# Patient Record
Sex: Female | Born: 1990 | Race: Black or African American | Hispanic: No | Marital: Single | Smoking: Former smoker
Health system: Southern US, Community
[De-identification: ages and names within clinical notes are randomized; demographics above are authoritative.]

## PROBLEM LIST (undated history)

## (undated) DIAGNOSIS — D759 Disease of blood and blood-forming organs, unspecified: Secondary | ICD-10-CM

## (undated) DIAGNOSIS — N83209 Unspecified ovarian cyst, unspecified side: Secondary | ICD-10-CM

## (undated) HISTORY — PX: OTHER SURGICAL HISTORY: SHX169

## (undated) HISTORY — DX: Unspecified ovarian cyst, unspecified side: N83.209

## (undated) HISTORY — DX: Disease of blood and blood-forming organs, unspecified: D75.9

---
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
## 2013-10-25 ENCOUNTER — Emergency Department: Payer: Self-pay | Admitting: Internal Medicine

## 2015-01-28 ENCOUNTER — Encounter: Payer: Self-pay | Admitting: Emergency Medicine

## 2015-01-28 ENCOUNTER — Emergency Department
Admission: EM | Admit: 2015-01-28 | Discharge: 2015-01-28 | Disposition: A | Discharge status: Home / Self Care | Payer: Self-pay | Attending: Emergency Medicine | Admitting: Emergency Medicine

## 2015-01-28 DIAGNOSIS — Y9389 Activity, other specified: Secondary | ICD-10-CM | POA: Insufficient documentation

## 2015-01-28 DIAGNOSIS — Y9289 Other specified places as the place of occurrence of the external cause: Secondary | ICD-10-CM | POA: Insufficient documentation

## 2015-01-28 DIAGNOSIS — Y998 Other external cause status: Secondary | ICD-10-CM | POA: Insufficient documentation

## 2015-01-28 DIAGNOSIS — X58XXXA Exposure to other specified factors, initial encounter: Secondary | ICD-10-CM | POA: Insufficient documentation

## 2015-01-28 DIAGNOSIS — T783XXA Angioneurotic edema, initial encounter: Secondary | ICD-10-CM

## 2015-01-28 MED ORDER — DIPHENHYDRAMINE HCL 50 MG PO CAPS
50.0000 mg | ORAL_CAPSULE | Freq: Once | ORAL | Status: AC
  Administered 2015-01-28: 50 mg via ORAL
  Filled 2015-01-28: qty 1

## 2015-01-28 MED ORDER — PREDNISONE 10 MG PO TABS: ORAL_TABLET | ORAL | Status: DC

## 2015-01-28 NOTE — ED Notes (Signed)
AAOx3.  Skin warm and dry.  NAD.  D/C home 

## 2015-01-28 NOTE — Discharge Instructions (Signed)
Angioedema Angioedema is sudden puffiness (swelling), often of the skin. It can happen:  On your face or privates (genitals).  In your belly (abdomen) or other body parts. It usually happens quickly and gets better in 1 or 2 days. It often starts at night and is found when you wake up. You may get red, itchy patches of skin (hives). Attacks can be dangerous if your breathing passages get puffy. The condition may happen only once, or it can come back at random times. It may happen for several years before it goes away for good. HOME CARE  Only take medicines as told by your doctor.  Always carry your emergency allergy medicines with you.  Wear a medical bracelet as told by your doctor.  Avoid things that you know will cause attacks (triggers). GET HELP IF:  You have another attack.  Your attacks happen more often or get worse.  The condition was passed to you by your parents and you want to have children. GET HELP RIGHT AWAY IF:   Your mouth, tongue, or lips are very puffy.  You have trouble breathing.  You have trouble swallowing.  You pass out (faint). MAKE SURE YOU:   Understand these instructions.  Will watch your condition.  Will get help right away if you are not doing well or get worse. Document Released: 06/07/2009 Document Revised: 04/09/2013 Document Reviewed: 02/10/2013 [facility] Patient Information 2015 [place], [place]. This information is not intended to replace advice given to you by your health care provider. Make sure you discuss any questions you have with your health care provider.   RETURN TO ER IMMEDIATELY IF ANY PROBLEMS SUCH AS SWALLOWING, SPEAKING OR BREATHING CONTINUE TAKING BENADRYL FOR ITCHING EVERY 6 HOURS  PREDNISONE FOR 3 DAYS AS DIRECTED

## 2015-01-28 NOTE — ED Notes (Signed)
Pt states she woke up this am with left upper lip swelling, denies allergies, pt states area is sore.

## 2015-01-28 NOTE — ED Provider Notes (Signed)
[facility] Emergency Department Provider Note  ____________________________________________  Time seen:  12:39 PM  I have reviewed the triage vital signs and the nursing notes.   HISTORY  Chief Complaint Oral Swelling   HPI [facility] is a 24 y.o. female patient is here today with complaint of left upper lip with swelling. She states she woke up this morning and noticed it. There is been no injury. She is unaware of any pimple in the area. She denies any previous allergies or problems since this. She has not taken any medication, new foods, new drinks, or new clothing. Patient denies any difficulty breathing or swallowing. She states the area is slightly sore to touch. She denies any fever. Currently her pain is 4 out of 10.  History reviewed. No pertinent past medical history.  There are no active problems to display for this patient.   History reviewed. No pertinent past surgical history.  Current Outpatient Rx  Name  Route  Sig  Dispense  Refill  . predniSONE (DELTASONE) 10 MG tablet      Take 3 tablets qd x 3 days   9 tablet   0     Allergies Review of patient's allergies indicates no known allergies.  No family history on file.  Social History History  Substance Use Topics  . Smoking status: Never Smoker   . Smokeless tobacco: Not on file  . Alcohol Use: No    Review of Systems Constitutional: No fever/chills Eyes: No visual changes. ENT: No sore throat. Cardiovascular: Denies chest pain. Respiratory: Denies shortness of breath. Gastrointestinal: No abdominal pain.  No nausea, no vomiting.   Genitourinary: Negative for dysuria. Musculoskeletal: Negative for back pain. Skin: Negative for rash.  Neurological: Negative for headaches, focal weakness or numbness.  10-point ROS otherwise negative.  ____________________________________________   PHYSICAL EXAM:  VITAL SIGNS: ED Triage Vitals  Enc Vitals Group     BP  01/28/15 1226 118/77 mmHg     Pulse Rate 01/28/15 1226 76     Resp 01/28/15 1226 18     Temp 01/28/15 1226 98 F (36.7 C)     Temp Source 01/28/15 1226 Oral     SpO2 01/28/15 1226 100 %     Weight 01/28/15 1226 188 lb (85.276 kg)     Height 01/28/15 1226  (1.651 m)     Head Cir --      Peak Flow --      Pain Score 01/28/15 1226 4     Pain Loc --      Pain Edu? --      Excl. in GC? --     Constitutional: Alert and oriented. Well appearing and in no acute distress. Eyes: Conjunctivae are normal. PERRL. EOMI. Head: Atraumatic. Nose: No congestion/rhinnorhea. Mouth/Throat: Mucous membranes are moist.  Oropharynx non-erythematous. Left upper lip very minimal edema was noted. No pimple or vesicle noted in the area. There is no trauma to the teeth or gums. Neck: No stridor.  Supple Hematological/Lymphatic/Immunilogical: No cervical lymphadenopathy. Cardiovascular: Normal rate, regular rhythm. Grossly normal heart sounds.  Good peripheral circulation. Respiratory: Normal respiratory effort.  No retractions. Lungs CTAB. Gastrointestinal: Soft and nontender. No distention Musculoskeletal: No lower extremity tenderness nor edema.  No joint effusions. Neurologic:  Normal speech and language. No gross focal neurologic deficits are appreciated. No gait instability. Skin:  Skin is warm, dry and intact. No rash noted. See note on mouth for skin findings Psychiatric: Mood and affect are  normal. Speech and behavior are normal.  ____________________________________________   LABS (all labs ordered are listed, but only abnormal results are displayed)  Labs Reviewed - No data to display  PROCEDURES  Procedure(s) performed: None  Critical Care performed: No  ____________________________________________   INITIAL IMPRESSION / ASSESSMENT AND PLAN / ED COURSE  Pertinent labs & imaging results that were available during my care of the patient were reviewed by me and considered in my  medical decision making (see chart for details).  Patient was observed and no continued upper lid swelling was noted. Patient still denies any difficulty with swallowing or breathing. She talks in complete sentences lungs are still clear. Patient states she will continue taking Benadryl as needed she was also given a prescription for prednisone for 3 days. She is aware that if anything worsens she is to comes immediately to the emergency room. ____________________________________________   FINAL CLINICAL IMPRESSION(S) / ED DIAGNOSES  Final diagnoses:  Angioedema, initial encounter      Tommi Rumps, PA-C 01/28/15 1341  Sharman Cheek, MD 01/28/15 [phone]

## 2017-01-07 ENCOUNTER — Encounter: Payer: Self-pay | Admitting: Emergency Medicine

## 2017-01-07 ENCOUNTER — Emergency Department: Payer: Self-pay

## 2017-01-07 DIAGNOSIS — Y929 Unspecified place or not applicable: Secondary | ICD-10-CM | POA: Insufficient documentation

## 2017-01-07 DIAGNOSIS — S90512A Abrasion, left ankle, initial encounter: Secondary | ICD-10-CM | POA: Insufficient documentation

## 2017-01-07 DIAGNOSIS — S93402A Sprain of unspecified ligament of left ankle, initial encounter: Secondary | ICD-10-CM | POA: Insufficient documentation

## 2017-01-07 DIAGNOSIS — Y939 Activity, unspecified: Secondary | ICD-10-CM | POA: Insufficient documentation

## 2017-01-07 DIAGNOSIS — Y999 Unspecified external cause status: Secondary | ICD-10-CM | POA: Insufficient documentation
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
## 2017-01-08 ENCOUNTER — Emergency Department
Admission: EM | Admit: 2017-01-08 | Discharge: 2017-01-08 | Disposition: A | Discharge status: Home / Self Care | Payer: Self-pay | Attending: Emergency Medicine | Admitting: Emergency Medicine

## 2017-01-08 DIAGNOSIS — S93402A Sprain of unspecified ligament of left ankle, initial encounter: Secondary | ICD-10-CM

## 2017-01-08 DIAGNOSIS — T148XXA Other injury of unspecified body region, initial encounter: Secondary | ICD-10-CM

## 2017-01-08 MED ORDER — BACITRACIN ZINC 500 UNIT/GM EX OINT
TOPICAL_OINTMENT | CUTANEOUS | Status: AC
  Administered 2017-01-08: 1 via TOPICAL

## 2017-01-08 MED ORDER — IBUPROFEN 600 MG PO TABS
600.0000 mg | ORAL_TABLET | Freq: Once | ORAL | Status: AC
  Administered 2017-01-08: 600 mg via ORAL
  Filled 2017-01-08: qty 1

## 2017-01-08 MED ORDER — IBUPROFEN 600 MG PO TABS: ORAL_TABLET | ORAL | 0 refills | Status: DC

## 2017-01-08 NOTE — ED Notes (Addendum)
Pt with swelling noted to left ankle. Pt complains of pain to medial left ankle. Bruising noted to medial left ankle and arch or left foot. Pt with abrasion with scabs noted to medial left ankle. Cms intact to left toes. Ice applied, pt encouraged to keep left foot elevated.

## 2017-01-08 NOTE — Discharge Instructions (Signed)
As we discussed, you do not have any broken or dislocated bones in your foot or ankle, but you do have an ankle sprain.  Please read through the included information about routine injury care (RICE = rest, ice, compression, elevation), and take over-the-counter pain medicine according to label instructions.  If you do not have any reason to avoid ibuprofen, you can also consider taking ibuprofen 600 mg 3 times a day with meals, but do this for no more than 5 days as it may cause to some stomach discomfort over time.  Use crutches if provided and you may bear weight as tolerated.  Follow-up is recommended with the orthopedic surgeon or with your regular doctor.  Keep the abrasion clean and dry and apply antibiotic ointment twice daily after washing it.
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
## 2017-01-08 NOTE — ED Notes (Addendum)
Abrasion cleansed with wound cleanser and dressed with antibiotic cream. Ace wrap applied to left ankle.

## 2017-02-13 ENCOUNTER — Encounter: Payer: Self-pay | Admitting: Emergency Medicine

## 2017-02-13 ENCOUNTER — Emergency Department: Payer: Self-pay

## 2017-02-13 ENCOUNTER — Emergency Department
Admission: EM | Admit: 2017-02-13 | Discharge: 2017-02-13 | Disposition: A | Discharge status: Home / Self Care | Payer: Self-pay | Attending: Emergency Medicine | Admitting: Emergency Medicine

## 2017-02-13 DIAGNOSIS — S93402A Sprain of unspecified ligament of left ankle, initial encounter: Secondary | ICD-10-CM | POA: Insufficient documentation

## 2017-02-13 DIAGNOSIS — Y929 Unspecified place or not applicable: Secondary | ICD-10-CM | POA: Insufficient documentation

## 2017-02-13 DIAGNOSIS — Y999 Unspecified external cause status: Secondary | ICD-10-CM | POA: Insufficient documentation

## 2017-02-13 DIAGNOSIS — Y9389 Activity, other specified: Secondary | ICD-10-CM | POA: Insufficient documentation

## 2017-02-13 NOTE — Discharge Instructions (Signed)
Ice and elevate ankle as needed for swelling. Continue using cam walker. Continue taking ibuprofen as needed for pain and inflammation. Follow-up with Dr. Orland Jarredroxler as needed for ankle pain.
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
## 2017-02-20 ENCOUNTER — Encounter: Payer: Self-pay | Admitting: *Deleted

## 2017-02-20 ENCOUNTER — Emergency Department
Admission: EM | Admit: 2017-02-20 | Discharge: 2017-02-20 | Disposition: A | Discharge status: Home / Self Care | Payer: Self-pay | Attending: Emergency Medicine | Admitting: Emergency Medicine

## 2017-02-20 DIAGNOSIS — S93402A Sprain of unspecified ligament of left ankle, initial encounter: Secondary | ICD-10-CM

## 2017-02-20 DIAGNOSIS — S93402D Sprain of unspecified ligament of left ankle, subsequent encounter: Secondary | ICD-10-CM | POA: Insufficient documentation

## 2017-02-20 MED ORDER — NAPROXEN 500 MG PO TABS: 500.0000 mg | ORAL_TABLET | Freq: Two times a day (BID) | ORAL | 0 refills | Status: DC
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
## 2017-06-05 ENCOUNTER — Emergency Department
Admission: EM | Admit: 2017-06-05 | Discharge: 2017-06-05 | Disposition: A | Discharge status: Home / Self Care | Payer: Self-pay | Attending: Emergency Medicine | Admitting: Emergency Medicine

## 2017-06-05 ENCOUNTER — Encounter: Payer: Self-pay | Admitting: Emergency Medicine

## 2017-06-05 DIAGNOSIS — A084 Viral intestinal infection, unspecified: Secondary | ICD-10-CM | POA: Insufficient documentation

## 2017-06-05 LAB — CBC
HCT: 37.1 % (ref 35.0–47.0)
Hemoglobin: 12.6 g/dL (ref 12.0–16.0)
MCH: 29.2 pg (ref 26.0–34.0)
MCHC: 34.1 g/dL (ref 32.0–36.0)
MCV: 85.7 fL (ref 80.0–100.0)
Platelets: 253 10*3/uL (ref 150–440)
RBC: 4.33 MIL/uL (ref 3.80–5.20)
RDW: 17.6 % — ABNORMAL HIGH (ref 11.5–14.5)
WBC: 5.8 10*3/uL (ref 3.6–11.0)

## 2017-06-05 LAB — COMPREHENSIVE METABOLIC PANEL
ALBUMIN: 3.8 g/dL (ref 3.5–5.0)
ALT: 21 U/L (ref 14–54)
ANION GAP: 9 (ref 5–15)
AST: 17 U/L (ref 15–41)
Alkaline Phosphatase: 57 U/L (ref 38–126)
BUN: 11 mg/dL (ref 6–20)
CO2: 24 mmol/L (ref 22–32)
Calcium: 9.1 mg/dL (ref 8.9–10.3)
Chloride: 102 mmol/L (ref 101–111)
Creatinine, Ser: 0.7 mg/dL (ref 0.44–1.00)
GFR calc Af Amer: >60 mL/min (ref >60)
GFR calc non Af Amer: >60 mL/min (ref >60)
GLUCOSE: 94 mg/dL (ref 65–99)
Potassium: 4.2 mmol/L (ref 3.5–5.1)
SODIUM: 135 mmol/L (ref 135–145)
TOTAL PROTEIN: 7.4 g/dL (ref 6.5–8.1)
Total Bilirubin: 0.6 mg/dL (ref 0.3–1.2)

## 2017-06-05 LAB — URINALYSIS, COMPLETE (UACMP) WITH MICROSCOPIC
BACTERIA UA: NONE SEEN
Bilirubin Urine: NEGATIVE
GLUCOSE, UA: NEGATIVE mg/dL
HGB URINE DIPSTICK: NEGATIVE
KETONES UR: NEGATIVE mg/dL
Leukocytes, UA: NEGATIVE
NITRITE: NEGATIVE
PROTEIN: NEGATIVE mg/dL
Specific Gravity, Urine: 1.017 (ref 1.005–1.030)
pH: 6 (ref 5.0–8.0)

## 2017-06-05 LAB — LIPASE, BLOOD: Lipase: 28 U/L (ref 11–51)

## 2017-06-05 NOTE — ED Triage Notes (Signed)
Patient presents to the ED with diarrhea that began on Sunday evening.  Patient states diarrhea improved mid-day on Monday but patient reports a large episode of diarrhea this morning. Patient denies vomiting.  Reports minor abdominal discomfort.  Denies any sharp or significant abdominal pain.  Patient is in no obvious distress at this time.

## 2017-06-05 NOTE — ED Provider Notes (Signed)
[facility] Emergency Department Provider Note  ____________________________________________   First MD Initiated Contact with Patient 06/05/17 1225     (approximate)  I have reviewed the triage vital signs and the nursing notes.   HISTORY  Chief Complaint Diarrhea   HPI Anna Little is a 26 y.o. female is here with complaint of diarrhea for the last 2 and half days. Patient is unaware of any fever area and she denies any vomiting. She has no family members with the same symptoms however a coworker did have diarrhea. Patient has continued to eat and drink. She states that today she has had 3 loose stools. In the last 24 hours she estimates maybe 3 episodes of watery diarrhea. She denies any abdominal pain. she is currently drinking a soda. She rates her iscomfort as 6 out of 10.   History reviewed. No pertinent past medical history.  There are no active problems to display for this patient.   History reviewed. No pertinent surgical history.  Prior to Admission medications   Not on File    Allergies Patient has no known allergies.  No family history on file.  Social History Social History   Tobacco Use  . Smoking status: Never Smoker  . Smokeless tobacco: Never Used  Substance Use Topics  . Alcohol use: No  . Drug use: No    Review of Systems Constitutional: No fever/chills Eyes: No visual changes. ENT: No sore throat. Cardiovascular: Denies chest pain. Respiratory: Denies shortness of breath. Gastrointestinal: No abdominal pain.  No nausea, no vomiting.  positive diarrhea.  Musculoskeletal: Negative for body aches. Skin: Negative for rash. Neurological: Negative for headaches, focal weakness or numbness. ___________________________________________   PHYSICAL EXAM:  VITAL SIGNS: ED Triage Vitals  Enc Vitals Group     BP 06/05/17 1132 114/69     Pulse Rate 06/05/17 1132 79     Resp 06/05/17 1132 18     Temp 06/05/17  1132 98 F (36.7 C)     Temp Source 06/05/17 1132 Oral     SpO2 06/05/17 1132 100 %     Weight 06/05/17 1132 180 lb (81.6 kg)     Height 06/05/17 1132 5\' 6"  (1.676 m)     Head Circumference --      Peak Flow --      Pain Score 06/05/17 1131 6     Pain Loc --      Pain Edu? --      Excl. in GC? --    Constitutional: Alert and oriented. Well appearing and in no acute distress. Eyes: Conjunctivae are normal. PERRL. EOMI. Head: Atraumatic. Nose: No congestion/rhinnorhea. Mouth/Throat: Mucous membranes are moist.  Oropharynx non-erythematous. Neck: No stridor.   Cardiovascular: Normal rate, regular rhythm. Grossly normal heart sounds.  Good peripheral circulation. Respiratory: Normal respiratory effort.  No retractions. Lungs CTAB. Gastrointestinal: Soft and nontender. No distention. Bowel sounds normoactive 4 quadrants. Musculoskeletal: moves upper and lower extremities without any difficulty. Normal gait was noted. Neurologic:  Normal speech and language. No gross focal neurologic deficits are appreciated. No gait instability. Skin:  Skin is warm, dry and intact. No rash noted. Psychiatric: Mood and affect are normal. Speech and behavior are normal.  ____________________________________________   LABS (all labs ordered are listed, but only abnormal results are displayed)  Labs Reviewed  CBC - Abnormal; Notable for the following components:      Result Value   RDW 17.6 (*)    All other components within  normal limits  URINALYSIS, COMPLETE (UACMP) WITH MICROSCOPIC - Abnormal; Notable for the following components:   Color, Urine YELLOW (*)    APPearance CLEAR (*)    Squamous Epithelial / LPF 0-5 (*)    All other components within normal limits  LIPASE, BLOOD  COMPREHENSIVE METABOLIC PANEL  POC URINE PREG, ED     PROCEDURES  Procedure(s) performed: None  Procedures  Critical Care performed: No  ____________________________________________   INITIAL IMPRESSION /  ASSESSMENT AND PLAN / ED COURSE Patient was encouraged to stay on clear liquids for the next 24 hours. She is to slowly add food back. She is encouraged to use Tylenol if needed for body aches or fever. She is also to remain out of work and a work note was provided. ____________________________________________   FINAL CLINICAL IMPRESSION(S) / ED DIAGNOSES  Final diagnoses:  Viral gastroenteritis     ED Discharge Orders    None       Note:  This document was prepared using Dragon voice recognition software and may include unintentional dictation errors.    Tommi RumpsSummers, Rhonda L, PA-C 06/05/17 1250    Governor RooksLord, Rebecca, MD 06/05/17 1330

## 2017-06-05 NOTE — ED Notes (Signed)
See triage note  States she developed some diarrhea on Sunday  sxs' eased off yesterday  Then returned today  Mild abd cramping  denies any fever or n/v

## 2017-06-05 NOTE — Discharge Instructions (Signed)
Begin clear liquid diet.  No solid food for 24 hours. Tylenol if needed for body aches or fever. [facility] if any continued problems.

## 2017-09-19 ENCOUNTER — Ambulatory Visit: Payer: Self-pay | Admitting: Family Medicine

## 2017-09-19 VITALS — BP 117/68 | HR 110 | Temp 98.8°F | Wt 216.0 lb

## 2017-09-19 DIAGNOSIS — R110 Nausea: Secondary | ICD-10-CM

## 2017-09-19 DIAGNOSIS — J029 Acute pharyngitis, unspecified: Secondary | ICD-10-CM

## 2017-09-19 DIAGNOSIS — J020 Streptococcal pharyngitis: Secondary | ICD-10-CM

## 2017-09-19 LAB — POCT RAPID STREP A (OFFICE): Rapid Strep A Screen: POSITIVE — AB

## 2017-09-19 LAB — POCT INFLUENZA A/B
INFLUENZA A, POC: NEGATIVE
Influenza B, POC: NEGATIVE

## 2017-09-19 MED ORDER — ONDANSETRON HCL 4 MG PO TABS: 4.0000 mg | ORAL_TABLET | Freq: Three times a day (TID) | ORAL | 0 refills | Status: DC | PRN

## 2017-09-19 MED ORDER — AMOXICILLIN 875 MG PO TABS: 875.0000 mg | ORAL_TABLET | Freq: Two times a day (BID) | ORAL | 0 refills | Status: DC

## 2017-09-19 NOTE — Progress Notes (Signed)
Anna Little is a 27 y.o. female who presents with 1 day of sore throat, fatigue, and malaise.   Review of Systems  Constitutional: Positive for fever and malaise/fatigue.  HENT: Positive for sore throat.   Eyes: Negative.   Respiratory: Negative.   Cardiovascular: Negative.   Gastrointestinal: Positive for nausea.  Genitourinary: Negative for dysuria, frequency and urgency.  Musculoskeletal: Positive for myalgias.  Skin: Negative.   Neurological: Negative.   Endo/Heme/Allergies: Negative.   Psychiatric/Behavioral: Negative.     O: Vitals:   09/19/17 1232  BP: 117/68  Pulse: (!) 110  Temp: 98.8 F (37.1 C)  SpO2: 98%   Physical Exam  Constitutional: She is oriented to person, place, and time. She appears well-developed and well-nourished. She is active.  HENT:  Head: Normocephalic and atraumatic.  Right Ear: Hearing, tympanic membrane, external ear and ear canal normal.  Left Ear: Hearing, tympanic membrane, external ear and ear canal normal.  Nose: Nose normal.  Mouth/Throat: Mucous membranes are normal. Posterior oropharyngeal erythema present.  Eyes: Pupils are equal, round, and reactive to light.  Neck: Normal range of motion. Neck supple.  Cardiovascular: Regular rhythm. Tachycardia present.  Pulmonary/Chest: Effort normal and breath sounds normal.  Abdominal: Soft. Bowel sounds are normal.  Musculoskeletal: Normal range of motion.  Lymphadenopathy:       Head (right side): Submandibular and tonsillar adenopathy present.       Head (left side): Submandibular and tonsillar adenopathy present.    She has cervical adenopathy.  Neurological: She is alert and oriented to person, place, and time.  Skin: Skin is warm and dry.    A: 1. Sore throat   2. Strep pharyngitis   3. Nausea     P:  Diagnosis medication treatment use and indications discussed with patient who verbalized understanding and agrees with POC at this time.  1. Strep pharyngitis -  amoxicillin (AMOXIL) 875 MG tablet; Take 1 tablet (875 mg total) by mouth 2 (two) times daily.  2. Sore throat - POCT rapid strep A- POSITIVE - POCT Influenza A/B- NEGATIVE - amoxicillin (AMOXIL) 875 MG tablet; Take 1 tablet (875 mg total) by mouth 2 (two) times daily.  3. Nausea - ondansetron (ZOFRAN) 4 MG tablet; Take 1 tablet (4 mg total) by mouth every 8 (eight) hours as needed for nausea or vomiting.  Other orders - ibuprofen (ADVIL,MOTRIN) 600 MG tablet; Take 1 tablet by mouth three times daily with meals

## 2017-09-19 NOTE — Patient Instructions (Addendum)
Change toothbrush after 48 hours on antibiotics Home from worn until antibiotics for 2 days and fever free Strep Throat Strep throat is an infection of the throat. It is caused by germs. Strep throat spreads from person to person because of coughing, sneezing, or close contact. Follow these instructions at home: Medicines  Take over-the-counter and prescription medicines only as told by your doctor.  Take your antibiotic medicine as told by your doctor. Do not stop taking the medicine even if you feel better.  Have family members who also have a sore throat or fever go to a doctor. Eating and drinking  Do not share food, drinking cups, or personal items.  Try eating soft foods until your sore throat feels better.  Drink enough fluid to keep your pee (urine) clear or pale yellow. General instructions  Rinse your mouth (gargle) with a salt-water mixture 3-4 times per day or as needed. To make a salt-water mixture, stir -1 tsp of salt into 1 cup of warm water.  Make sure that all people in your house wash their hands well.  Rest.  Stay home from school or work until you have been taking antibiotics for 24 hours.  Keep all follow-up visits as told by your doctor. This is important. Contact a doctor if:  Your neck keeps getting bigger.  You get a rash, cough, or earache.  You cough up thick liquid that is green, yellow-brown, or bloody.  You have pain that does not get better with medicine.  Your problems get worse instead of getting better.  You have a fever. Get help right away if:  You throw up (vomit).  You get a very bad headache.  You neck hurts or it feels stiff.  You have chest pain or you are short of breath.  You have drooling, very bad throat pain, or changes in your voice.  Your neck is swollen or the skin gets red and tender.  Your mouth is dry or you are peeing less than normal.  You keep feeling more tired or it is hard to wake up.  Your joints  are red or they hurt. This information is not intended to replace advice given to you by your health care provider. Make sure you discuss any questions you have with your health care provider. Document Released: 12/06/2007 Document Revised: 02/16/2016 Document Reviewed: 10/12/2014 Elsevier Interactive Patient Education  [employer]2018 [employer]vier [employer]

## 2017-09-24 ENCOUNTER — Telehealth: Payer: Self-pay | Admitting: Emergency Medicine

## 2017-09-24 NOTE — Telephone Encounter (Signed)
Unable to leave message mailbox full.

## 2018-08-16 IMAGING — CR DG ANKLE COMPLETE 3+V*L*
3 series · 3 of 3 positions shown · non-contrast
Comparison: None.

CLINICAL DATA: 25-year-old female with left ankle injury and pain.

EXAM:
LEFT ANKLE COMPLETE - 3+ VIEW

[ankle ap]
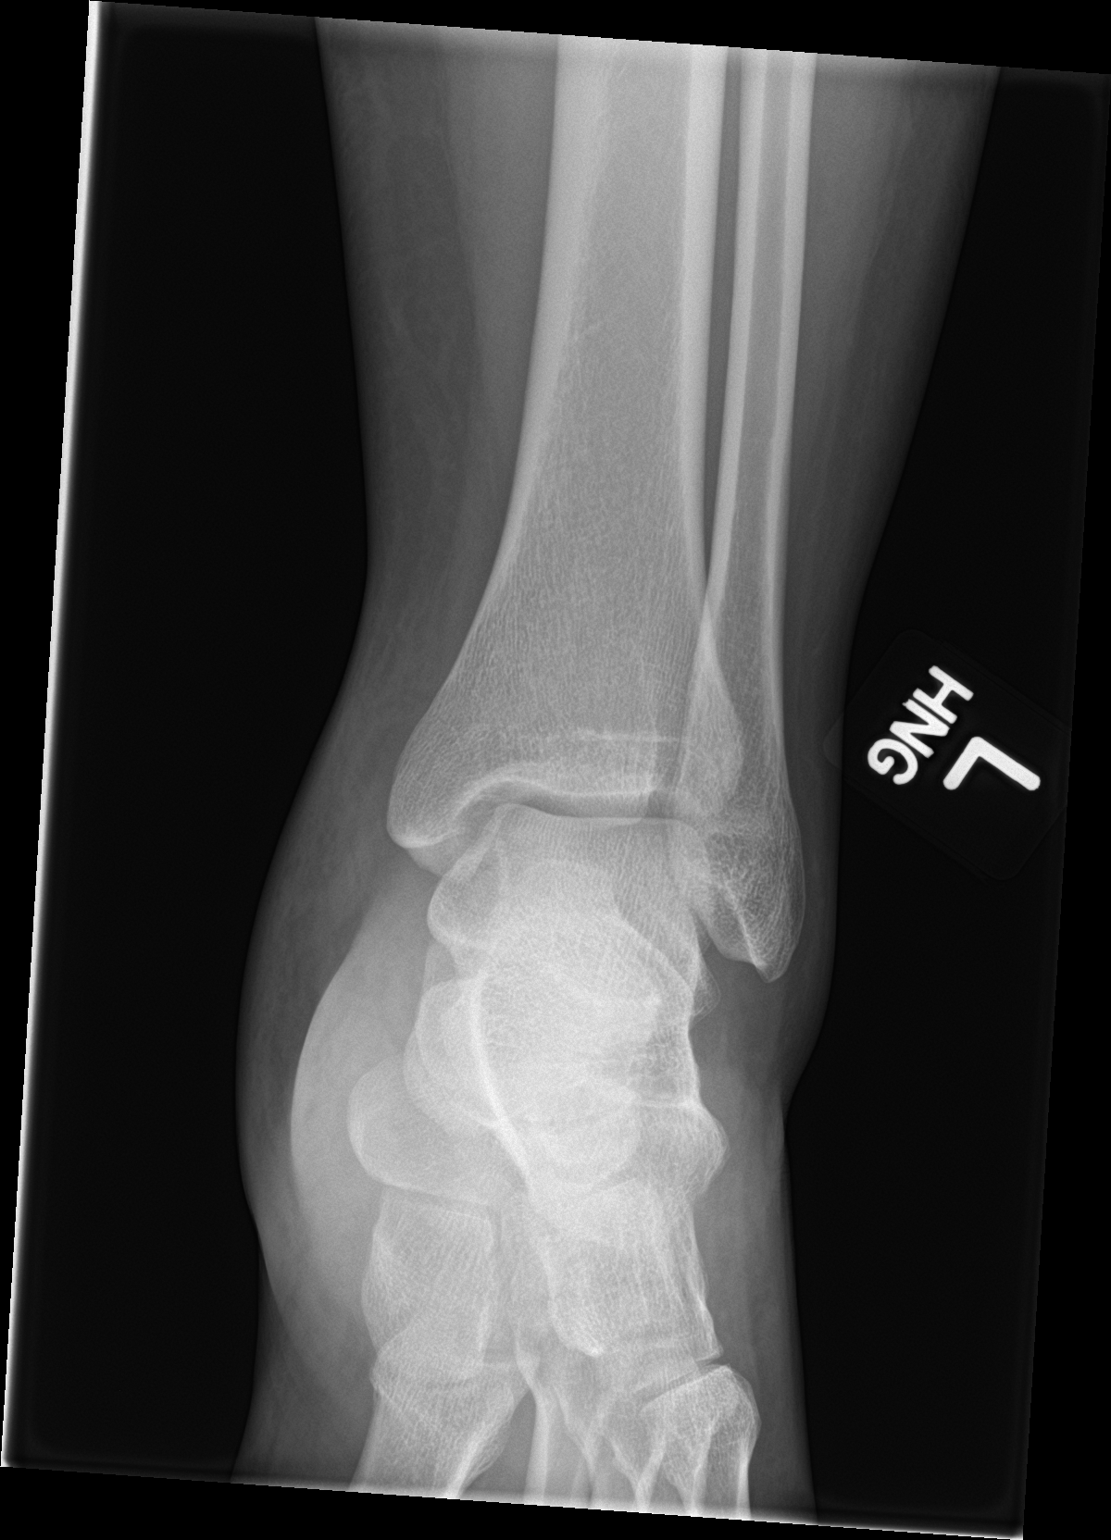

[ankle obl]
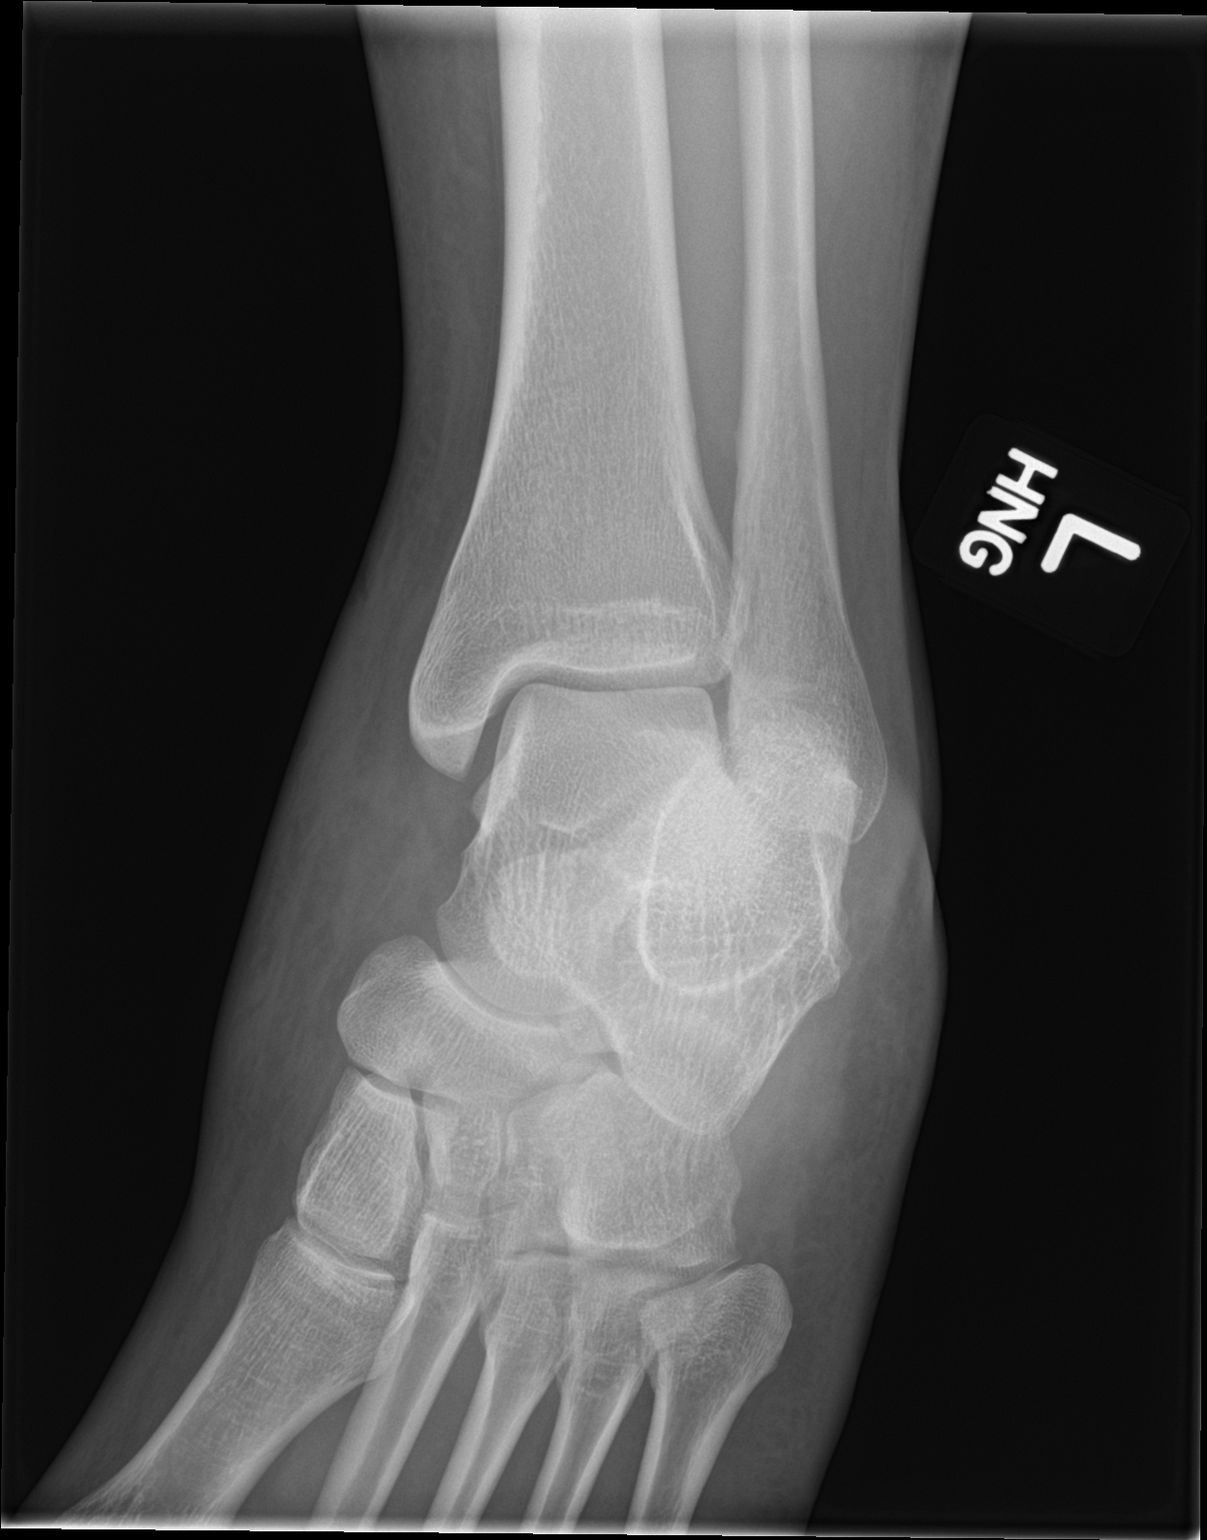

[ankle lat]
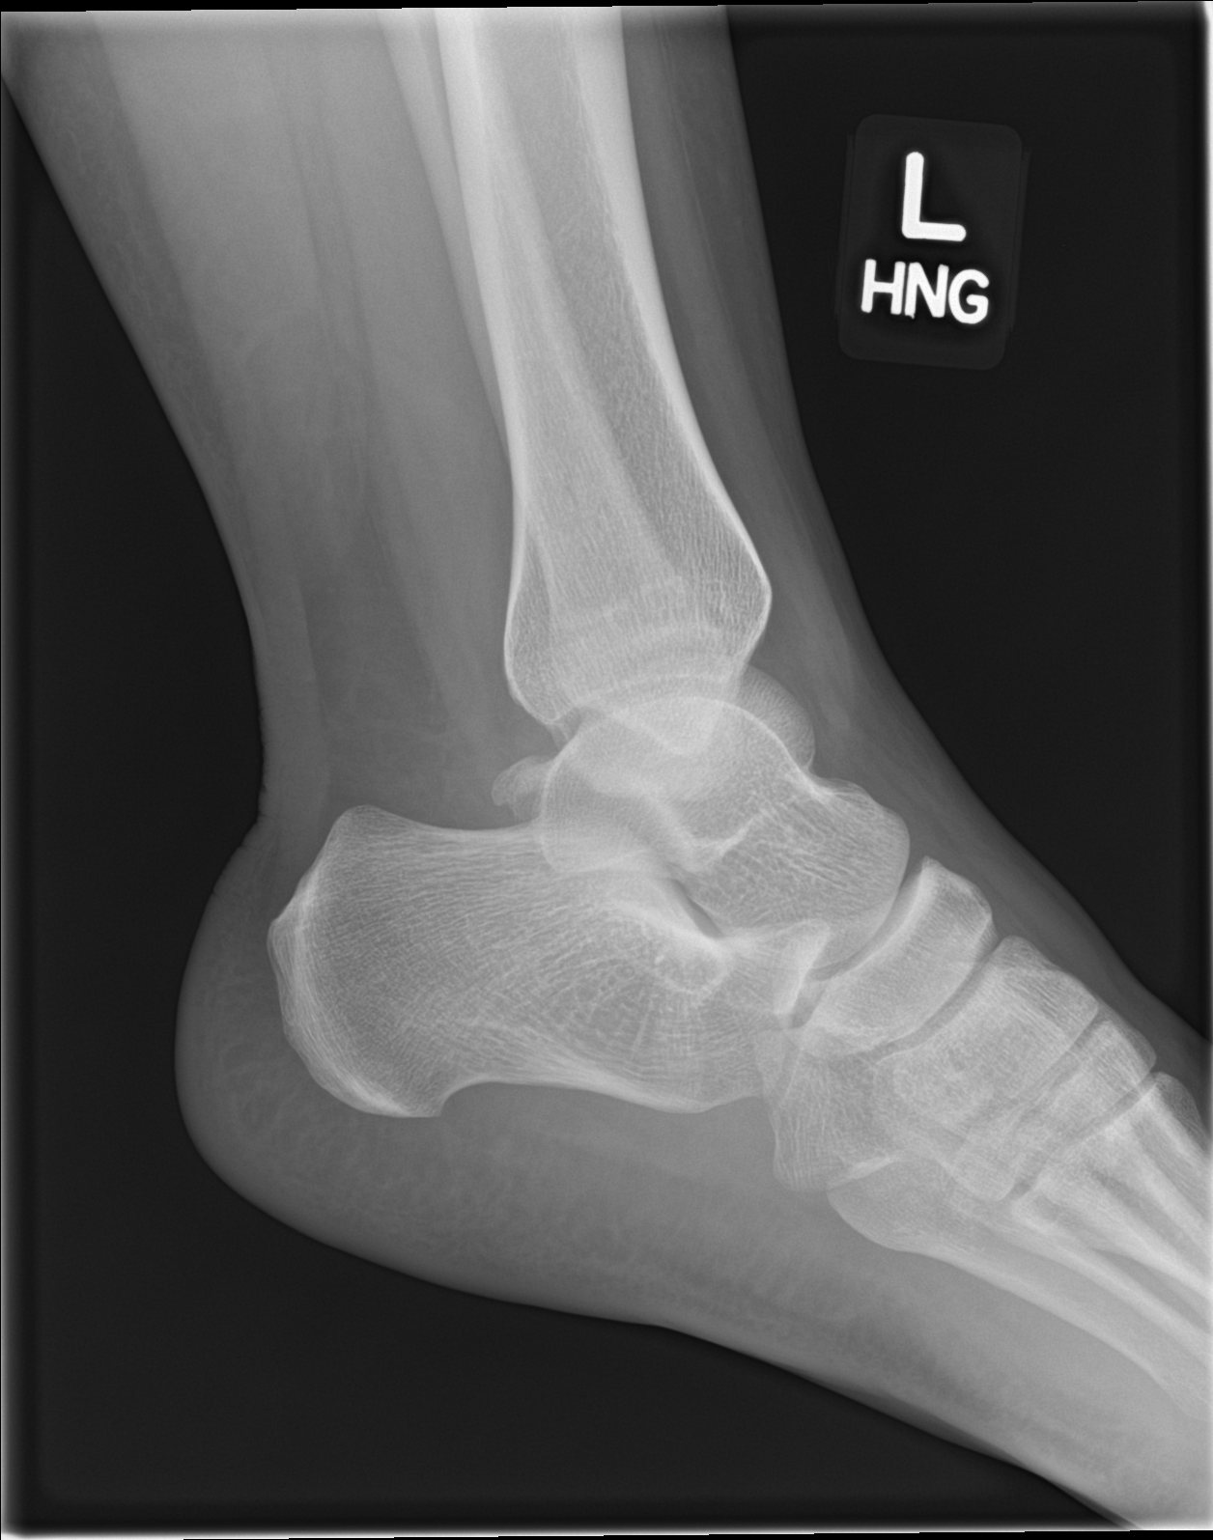

[3 of 3 positions shown; findings below may reference images not displayed]

FINDINGS: There is no acute fracture or dislocation. The bones are well
mineralized. No arthritic changes. There is soft tissue swelling
primarily over the medial malleolus. Underlying ligamentous injury
is not excluded. Clinical correlation is recommended.
IMPRESSION: No acute fracture or dislocation.

## 2019-09-30 ENCOUNTER — Encounter: Payer: Self-pay | Admitting: Medical Oncology

## 2019-09-30 ENCOUNTER — Emergency Department
Admission: EM | Admit: 2019-09-30 | Discharge: 2019-09-30 | Disposition: A | Discharge status: Home / Self Care | Payer: Self-pay | Attending: Student in an Organized Health Care Education/Training Program | Admitting: Student in an Organized Health Care Education/Training Program

## 2019-09-30 ENCOUNTER — Other Ambulatory Visit: Payer: Self-pay

## 2019-09-30 DIAGNOSIS — Z7689 Persons encountering health services in other specified circumstances: Secondary | ICD-10-CM

## 2019-09-30 DIAGNOSIS — Z0279 Encounter for issue of other medical certificate: Secondary | ICD-10-CM | POA: Insufficient documentation

## 2019-09-30 NOTE — Discharge Instructions (Signed)
Call health at work for further information about your returning to work. You may also follow-up with your primary care provider if additional paperwork is needed. A note for an additional 4 days to comply with CDC guidelines was written for today.

## 2019-09-30 NOTE — ED Triage Notes (Signed)
Pt reports that she was diagnosed with covid last Tuesday but is not feeling like she can return to work so needs a note.

## 2019-09-30 NOTE — ED Provider Notes (Signed)
[facility] Emergency Department Provider Note   ____________________________________________   First MD Initiated Contact with Patient 09/30/19 1107     (approximate)  I have reviewed the triage vital signs and the nursing notes.   HISTORY  Chief Complaint Letter for School/Work   HPI Anna Little is a 29 y.o. female presents to the ED for a return to work note.  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  Patient reports that she tested positive for Covid and was told to remain at  home.  She states the test was done here at the hospital but nursing staff and myself were unable to see the results of this test or that it was done.  Patient reports that is been 10 days since the onset of her symptoms.  She denies any fever, chills, nausea or vomiting.  Patient states that she is tired.      History reviewed. No pertinent past medical history.  There are no problems to display for this patient.   History reviewed. No pertinent surgical history.  Prior to Admission medications   Not on File    Allergies Patient has no known allergies.  No family history on file.  Social History Social History   Tobacco Use  . Smoking status: Never Smoker  . Smokeless tobacco: Never Used  Substance Use Topics  . Alcohol use: No  . Drug use: No    Review of Systems Constitutional: No fever/chills Eyes: No visual changes. ENT: No sore throat. Cardiovascular: Denies chest pain. Respiratory: Denies shortness of breath. Gastrointestinal: No abdominal pain.  No nausea, no vomiting.  No diarrhea.   Genitourinary: Negative for dysuria. Musculoskeletal: Negative for muscle aches. Skin: Negative for rash. Neurological: Negative for headaches, focal weakness or numbness. ____________________________________________   PHYSICAL EXAM:  VITAL SIGNS: ED Triage Vitals  Enc Vitals Group     BP 09/30/19 1059 120/75       Pulse Rate 09/30/19 1059 (!) 106     Resp 09/30/19 1059 16     Temp 09/30/19 1059 98.4 F (36.9 C)     Temp Source 09/30/19 1059 Oral     SpO2 09/30/19 1059 98 %     Weight 09/30/19 1056 216 lb 0.8 oz (98 kg)     Height 09/30/19 1056 5\' 5"  (1.651 m)     Head Circumference --      Peak Flow --      Pain Score 09/30/19 1056 0     Pain Loc --      Pain Edu? --      Excl. in GC? --    Constitutional: Alert and oriented. Well appearing and in no acute distress. Eyes: Conjunctivae are normal. PERRL. EOMI. Head: Atraumatic. Nose: No congestion/rhinnorhea. Neck: No stridor.   Cardiovascular: Normal rate, regular rhythm. Grossly normal heart sounds.  Good peripheral circulation. Respiratory: Normal respiratory effort.  No retractions. Lungs CTAB. Musculoskeletal: Moves upper and lower extremities without any difficulty.  Normal gait was noted. Neurologic:  Normal speech and language. No gross focal neurologic deficits are appreciated. No gait instability. Skin:  Skin is warm, dry and intact. No rash noted. Psychiatric: Mood and affect are normal. Speech and behavior are normal.  ____________________________________________   LABS (all labs ordered are listed, but only abnormal results are displayed)  Labs Reviewed - No data to display  PROCEDURES  Procedure(s) performed (including Critical Care):  Procedures   ____________________________________________  INITIAL IMPRESSION / ASSESSMENT AND PLAN / ED COURSE  As part of my medical decision making, I reviewed the following data within the electronic MEDICAL RECORD NUMBER Notes from prior ED visits and [place] Controlled Substance Database  29 year old female presents to the ED for a return to work note and also hoping that we would fill out her [place] papers for her absence from work.  Patient was made aware that the emergency department does not fill out FLMA papers.  Health at work was contacted and a message was left.  Reportedly  patient was released to return to work.  However due to CDC guidelines patient has only been out 10 days.  A note for 4 more days was written and she will need to follow-up with health at work for any further paperwork.  ____________________________________________   FINAL CLINICAL IMPRESSION(S) / ED DIAGNOSES  Final diagnoses:  Return to work exam     ED Discharge Orders    None       Note:  This document was prepared using Dragon voice recognition software and may include unintentional dictation errors.    Johnn Hai, PA-C 09/30/19 1310    Merlyn Lot, MD 09/30/19 1314

## 2019-09-30 NOTE — ED Notes (Signed)
See triage note states she was sent by Health at work last week and was tested positive for COVID  States she was cleared by [employer] at [employer] to return to work  States she still feels fatigued

## 2022-01-08 ENCOUNTER — Emergency Department
Admission: EM | Admit: 2022-01-08 | Discharge: 2022-01-08 | Disposition: A | Discharge status: Home / Self Care | Payer: Self-pay | Attending: Student in an Organized Health Care Education/Training Program | Admitting: Student in an Organized Health Care Education/Training Program

## 2022-01-08 ENCOUNTER — Emergency Department: Payer: Self-pay

## 2022-01-08 ENCOUNTER — Encounter: Payer: Self-pay | Admitting: Emergency Medicine

## 2022-01-08 ENCOUNTER — Other Ambulatory Visit: Payer: Self-pay

## 2022-01-08 DIAGNOSIS — Y9289 Other specified places as the place of occurrence of the external cause: Secondary | ICD-10-CM | POA: Insufficient documentation

## 2022-01-08 DIAGNOSIS — M25552 Pain in left hip: Secondary | ICD-10-CM | POA: Diagnosis present

## 2022-01-08 MED ORDER — CYCLOBENZAPRINE HCL 10 MG PO TABS: 10.0000 mg | ORAL_TABLET | Freq: Three times a day (TID) | ORAL | 0 refills | Status: AC | PRN
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
## 2022-02-14 ENCOUNTER — Ambulatory Visit (LOCAL_COMMUNITY_HEALTH_CENTER): Payer: Self-pay | Admitting: Advanced Practice Midwife

## 2022-02-14 ENCOUNTER — Encounter: Payer: Self-pay | Admitting: Advanced Practice Midwife

## 2022-02-14 VITALS — BP 115/71 | HR 68 | Ht 66.0 in | Wt 204.2 lb

## 2022-02-14 DIAGNOSIS — Z62810 Personal history of physical and sexual abuse in childhood: Secondary | ICD-10-CM | POA: Insufficient documentation

## 2022-02-14 DIAGNOSIS — Z789 Other specified health status: Secondary | ICD-10-CM

## 2022-02-14 DIAGNOSIS — Z3009 Encounter for other general counseling and advice on contraception: Secondary | ICD-10-CM

## 2022-02-14 DIAGNOSIS — E669 Obesity, unspecified: Secondary | ICD-10-CM | POA: Insufficient documentation

## 2022-02-14 DIAGNOSIS — Z01419 Encounter for gynecological examination (general) (routine) without abnormal findings: Secondary | ICD-10-CM

## 2022-02-14 LAB — WET PREP FOR TRICH, YEAST, CLUE
Trichomonas Exam: NEGATIVE
Yeast Exam: NEGATIVE

## 2022-02-14 NOTE — Addendum Note (Signed)
Addended by: Arnetha Courser on: 02/14/2022 03:26 PM   Modules accepted: Orders

## 2022-02-14 NOTE — Progress Notes (Signed)
Per client, last PAP was 7 years ago at [facility]. ROI signed and faxed with confirmation received. Jossie Ng, RN Wet prep negative and no intervention required per standing order. Jossie Ng, RN
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
## 2022-02-18 LAB — IGP, APTIMA HPV
HPV Aptima: NEGATIVE
PAP Smear Comment: 0

## 2024-09-09 ENCOUNTER — Encounter: Payer: Self-pay | Admitting: Obstetrics and Gynecology
